# Patient Record
Sex: Male | Born: 2000 | Race: White | Hispanic: No | Marital: Single | Smoking: Never smoker
Health system: Southern US, Community
[De-identification: ages and names within clinical notes are randomized; demographics above are authoritative.]

---
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
## 2024-02-01 ENCOUNTER — Ambulatory Visit (INDEPENDENT_AMBULATORY_CARE_PROVIDER_SITE_OTHER): Payer: Self-pay | Admitting: Family Medicine

## 2024-02-01 ENCOUNTER — Encounter: Payer: Self-pay | Admitting: Family Medicine

## 2024-02-01 ENCOUNTER — Other Ambulatory Visit: Payer: Self-pay

## 2024-02-01 VITALS — BP 118/81 | HR 59 | Temp 98.6°F | Ht 73.5 in | Wt 149.9 lb

## 2024-02-01 DIAGNOSIS — M546 Pain in thoracic spine: Secondary | ICD-10-CM | POA: Diagnosis not present

## 2024-02-01 DIAGNOSIS — H538 Other visual disturbances: Secondary | ICD-10-CM | POA: Diagnosis not present

## 2024-02-01 DIAGNOSIS — Z113 Encounter for screening for infections with a predominantly sexual mode of transmission: Secondary | ICD-10-CM

## 2024-02-01 DIAGNOSIS — Z0000 Encounter for general adult medical examination without abnormal findings: Secondary | ICD-10-CM

## 2024-02-01 MED ORDER — OMEPRAZOLE 20 MG PO CPDR: 20.0000 mg | DELAYED_RELEASE_CAPSULE | Freq: Every day | ORAL | 3 refills | Status: DC

## 2024-02-01 NOTE — Progress Notes (Unsigned)
 BP 118/81 (BP Location: Right Arm, Patient Position: Sitting, Cuff Size: Normal)   Pulse (!) 59   Temp 98.6 F (37 C) (Oral)   Ht 6' 1.5" (1.867 m)   Wt 149 lb 14.4 oz (68 kg)   SpO2 97%   BMI 19.51 kg/m    Subjective:    Patient ID: Aaron Mccann, male    DOB: 06-29-2001, 23 y.o.   MRN: [id number]  HPI: Aaron Mccann is a 23 y.o. male presenting on 02/01/2024 to establish care and for comprehensive medical examination. Current medical complaints include:  BACK PAIN Duration: about a year Mechanism of injury: unknown Location: midline and upper back Onset: sudden Severity: severe Quality: sharp Frequency: when he drinks alcohol Radiation: none Aggravating factors: drinking alcohol Alleviating factors: nothing Status: fluctuating Treatments attempted: none  Relief with NSAIDs?: no Nighttime pain:  no Paresthesias / decreased sensation:  no Bowel / bladder incontinence:  no Fevers:  no Dysuria / urinary frequency:  no  Interim Problems from his last visit: no  Depression Screen done today and results listed below:     02/01/2024   11:37 AM  Depression screen PHQ 2/9  Decreased Interest 0  Down, Depressed, Hopeless 0  PHQ - 2 Score 0  Altered sleeping 1  Tired, decreased energy 0  Change in appetite 0  Feeling bad or failure about yourself  0  Trouble concentrating 0  Moving slowly or fidgety/restless 0  Suicidal thoughts 0  PHQ-9 Score 1  Difficult doing work/chores Not difficult at all    Past Medical History:  History reviewed. No pertinent past medical history.  Surgical History:  History reviewed. No pertinent surgical history.  Medications:  No current outpatient medications on file prior to visit.   No current facility-administered medications on file prior to visit.    Allergies:  No Known Allergies  Social History:  Social History   Socioeconomic History   Marital status: Single    Spouse name: Not on file   Number of  children: Not on file   Years of education: Not on file   Highest education level: Not on file  Occupational History   Not on file  Tobacco Use   Smoking status: Never   Smokeless tobacco: Never  Vaping Use   Vaping status: Every Day  Substance and Sexual Activity   Alcohol use: Not Currently   Drug use: Yes    Frequency: 3.0 times per week    Types: Marijuana   Sexual activity: Not Currently  Other Topics Concern   Not on file  Social History Narrative   ** Merged History Encounter **       Social Drivers of Health   Financial Resource Strain: Low Risk  (02/01/2024)   Overall Financial Resource Strain (CARDIA)    Difficulty of Paying Living Expenses: Not hard at all  Food Insecurity: No Food Insecurity (02/01/2024)   Hunger Vital Sign    Worried About Running Out of Food in the Last Year: Never true    Ran Out of Food in the Last Year: Never true  Transportation Needs: No Transportation Needs (02/01/2024)   PRAPARE - Administrator, Civil Service (Medical): No    Lack of Transportation (Non-Medical): No  Physical Activity: Inactive (02/01/2024)   Exercise Vital Sign    Days of Exercise per Week: 0 days    Minutes of Exercise per Session: 0 min  Stress: No Stress Concern Present (02/01/2024)   [place]  [employer] - Occupational Stress Questionnaire    Feeling of Stress : Not at all  Social Connections: Not on file  Intimate Partner Violence: Not At Risk (02/01/2024)   Humiliation, Afraid, Rape, and Kick questionnaire    Fear of Current or Ex-Partner: No    Emotionally Abused: No    Physically Abused: No    Sexually Abused: No   Social History   Tobacco Use  Smoking Status Never  Smokeless Tobacco Never   Social History   Substance and Sexual Activity  Alcohol Use Not Currently    Family History:  Family History  Problem Relation Age of Onset   Multiple sclerosis Mother    Ulcers Father    Breast cancer Maternal Grandmother     Cancer Maternal Grandfather     Past medical history, surgical history, medications, allergies, family history and social history reviewed with patient today and changes made to appropriate areas of the chart.   Review of Systems  Constitutional: Negative.   HENT: Negative.    Eyes:  Positive for blurred vision. Negative for double vision, photophobia, pain, discharge and redness.  Respiratory: Negative.    Cardiovascular: Negative.   Gastrointestinal: Negative.   Genitourinary: Negative.   Musculoskeletal:  Positive for back pain. Negative for falls, joint pain, myalgias and neck pain.  Skin: Negative.   Neurological: Negative.   Endo/Heme/Allergies: Negative.   Psychiatric/Behavioral: Negative.     All other ROS negative except what is listed above and in the HPI.      Objective:    BP 118/81 (BP Location: Right Arm, Patient Position: Sitting, Cuff Size: Normal)   Pulse (!) 59   Temp 98.6 F (37 C) (Oral)   Ht 6' 1.5" (1.867 m)   Wt 149 lb 14.4 oz (68 kg)   SpO2 97%   BMI 19.51 kg/m   Wt Readings from Last 3 Encounters:  02/01/24 149 lb 14.4 oz (68 kg)  04/08/19 130 lb 1.1 oz (59 kg) (35%, Z= -0.40)*  04/08/19 130 lb (59 kg) (34%, Z= -0.40)*   * Growth percentiles are based on CDC (Boys, 2-20 Years) data.    Physical Exam Vitals and nursing note reviewed.  Constitutional:      General: He is not in acute distress.    Appearance: Normal appearance. He is not ill-appearing, toxic-appearing or diaphoretic.  HENT:     Head: Normocephalic and atraumatic.     Right Ear: Tympanic membrane, ear canal and external ear normal. There is no impacted cerumen.     Left Ear: Tympanic membrane, ear canal and external ear normal. There is no impacted cerumen.     Nose: Nose normal. No congestion or rhinorrhea.     Mouth/Throat:     Mouth: Mucous membranes are moist.     Pharynx: Oropharynx is clear. No oropharyngeal exudate or posterior oropharyngeal erythema.  Eyes:      General: No scleral icterus.       Right eye: No discharge.        Left eye: No discharge.     Extraocular Movements: Extraocular movements intact.     Conjunctiva/sclera: Conjunctivae normal.     Pupils: Pupils are equal, round, and reactive to light.  Neck:     Vascular: No carotid bruit.  Cardiovascular:     Rate and Rhythm: Normal rate and regular rhythm.     Pulses: Normal pulses.     Heart sounds: No murmur heard.    No friction rub.  No gallop.  Pulmonary:     Effort: Pulmonary effort is normal. No respiratory distress.     Breath sounds: Normal breath sounds. No stridor. No wheezing, rhonchi or rales.  Chest:     Chest wall: No tenderness.  Abdominal:     General: Abdomen is flat. Bowel sounds are normal. There is no distension.     Palpations: Abdomen is soft. There is no mass.     Tenderness: There is no abdominal tenderness. There is no right CVA tenderness, left CVA tenderness, guarding or rebound.     Hernia: No hernia is present.  Genitourinary:    Comments: Genital exam deferred with shared decision making Musculoskeletal:        General: No swelling, tenderness, deformity or signs of injury. Normal range of motion.     Cervical back: Normal range of motion and neck supple. No rigidity. No muscular tenderness.     Right lower leg: No edema.     Left lower leg: No edema.  Lymphadenopathy:     Cervical: No cervical adenopathy.  Skin:    General: Skin is warm and dry.     Capillary Refill: Capillary refill takes less than 2 seconds.     Coloration: Skin is not jaundiced or pale.     Findings: No bruising, erythema, lesion or rash.  Neurological:     General: No focal deficit present.     Mental Status: He is alert and oriented to person, place, and time.     Cranial Nerves: No cranial nerve deficit.     Sensory: No sensory deficit.     Motor: No weakness.     Coordination: Coordination normal.     Gait: Gait normal.     Deep Tendon Reflexes: Reflexes normal.   Psychiatric:        Mood and Affect: Mood normal.        Behavior: Behavior normal.        Thought Content: Thought content normal.        Judgment: Judgment normal.    Results for orders placed or performed during the hospital encounter of 04/08/19  Novel Coronavirus,NAA,(SEND-OUT TO REF LAB - TAT 24-48 hrs); Hosp Order   Collection Time: 04/08/19  7:45 PM   Specimen: Nasopharyngeal Swab; Respiratory  Result Value Ref Range   SARS-CoV-2, NAA NOT DETECTED NOT DETECTED   Coronavirus Source NASOPHARYNGEAL       Assessment & Plan:   Problem List Items Addressed This Visit   None    LABORATORY TESTING:  Health maintenance labs ordered today as discussed above.   IMMUNIZATIONS:   - Tdap: Tetanus vaccination status reviewed: {tetanus status:315746}. - Influenza: {Blank single:19197::"Up to date","Administered today","Postponed to flu season","Refused","Given elsewhere"} - Pneumovax: {Blank single:19197::"Up to date","Administered today","Not applicable","Refused","Given elsewhere"} - Prevnar: {Blank single:19197::"Up to date","Administered today","Not applicable","Refused","Given elsewhere"} - COVID: {Blank single:19197::"Up to date","Administered today","Not applicable","Refused","Given elsewhere"} - HPV: {Blank single:19197::"Up to date","Administered today","Not applicable","Refused","Given elsewhere"}  PATIENT COUNSELING:    Sexuality: Discussed sexually transmitted diseases, partner selection, use of condoms, avoidance of unintended pregnancy  and contraceptive alternatives.   Advised to avoid cigarette smoking.  I discussed with the patient that most people either abstain from alcohol or drink within safe limits (<=14/week and <=4 drinks/occasion for males, <=7/weeks and <= 3 drinks/occasion for females) and that the risk for alcohol disorders and other health effects rises proportionally with the number of drinks per week and how often a drinker exceeds daily  limits.  Discussed cessation/primary prevention of  drug use and availability of treatment for abuse.   Diet: Encouraged to adjust caloric intake to maintain  or achieve ideal body weight, to reduce intake of dietary saturated fat and total fat, to limit sodium intake by avoiding high sodium foods and not adding table salt, and to maintain adequate dietary potassium and calcium preferably from fresh fruits, vegetables, and low-fat dairy products.    stressed the importance of regular exercise  Injury prevention: Discussed safety belts, safety helmets, smoke detector, smoking near bedding or upholstery.   Dental health: Discussed importance of regular tooth brushing, flossing, and dental visits.   Follow up plan: NEXT PREVENTATIVE PHYSICAL DUE IN 1 YEAR. No follow-ups on file.

## 2024-02-02 LAB — COMPREHENSIVE METABOLIC PANEL WITH GFR
ALT: 11 IU/L (ref 0–44)
AST: 16 IU/L (ref 0–40)
Albumin: 5.5 g/dL — ABNORMAL HIGH (ref 4.3–5.2)
Alkaline Phosphatase: 73 IU/L (ref 44–121)
BUN/Creatinine Ratio: 6 — ABNORMAL LOW (ref 9–20)
BUN: 6 mg/dL (ref 6–20)
Bilirubin Total: 0.6 mg/dL (ref 0.0–1.2)
CO2: 25 mmol/L (ref 20–29)
Calcium: 9.9 mg/dL (ref 8.7–10.2)
Chloride: 98 mmol/L (ref 96–106)
Creatinine, Ser: 0.93 mg/dL (ref 0.76–1.27)
Globulin, Total: 2.6 g/dL (ref 1.5–4.5)
Glucose: 71 mg/dL (ref 70–99)
Potassium: 3.9 mmol/L (ref 3.5–5.2)
Sodium: 140 mmol/L (ref 134–144)
Total Protein: 8.1 g/dL (ref 6.0–8.5)
eGFR: 120 mL/min/{1.73_m2} (ref >59)

## 2024-02-02 LAB — TSH: TSH: 0.846 u[IU]/mL (ref 0.450–4.500)

## 2024-02-02 LAB — CBC WITH DIFFERENTIAL/PLATELET
Basophils Absolute: 0.1 10*3/uL (ref 0.0–0.2)
Basos: 1 %
EOS (ABSOLUTE): 0.1 10*3/uL (ref 0.0–0.4)
Eos: 1 %
Hematocrit: 48.4 % (ref 37.5–51.0)
Hemoglobin: 16.2 g/dL (ref 13.0–17.7)
Immature Grans (Abs): 0 10*3/uL (ref 0.0–0.1)
Immature Granulocytes: 0 %
Lymphocytes Absolute: 1.7 10*3/uL (ref 0.7–3.1)
Lymphs: 33 %
MCH: 30.1 pg (ref 26.6–33.0)
MCHC: 33.5 g/dL (ref 31.5–35.7)
MCV: 90 fL (ref 79–97)
Monocytes Absolute: 0.5 10*3/uL (ref 0.1–0.9)
Monocytes: 9 %
Neutrophils Absolute: 2.8 10*3/uL (ref 1.4–7.0)
Neutrophils: 56 %
Platelets: 229 10*3/uL (ref 150–450)
RBC: 5.39 x10E6/uL (ref 4.14–5.80)
RDW: 13.3 % (ref 11.6–15.4)
WBC: 5 10*3/uL (ref 3.4–10.8)

## 2024-02-02 LAB — LIPID PANEL W/O CHOL/HDL RATIO
Cholesterol, Total: 235 mg/dL — ABNORMAL HIGH (ref 100–199)
HDL: 39 mg/dL — ABNORMAL LOW (ref >39)
LDL Chol Calc (NIH): 173 mg/dL — ABNORMAL HIGH (ref 0–99)
Triglycerides: 127 mg/dL (ref 0–149)
VLDL Cholesterol Cal: 23 mg/dL (ref 5–40)

## 2024-02-02 LAB — TREPONEMAL ANTIBODIES, TPPA: Treponemal Antibodies, TPPA: NONREACTIVE

## 2024-02-02 LAB — ACUTE VIRAL HEPATITIS (HAV, HBV, HCV)
HCV Ab: NONREACTIVE
Hep A IgM: NEGATIVE
Hep B C IgM: NEGATIVE
Hepatitis B Surface Ag: NEGATIVE

## 2024-02-02 LAB — HSV 1 AND 2 AB, IGG
HSV 1 Glycoprotein G Ab, IgG: NONREACTIVE
HSV 2 IgG, Type Spec: NONREACTIVE

## 2024-02-02 LAB — HCV INTERPRETATION

## 2024-02-02 LAB — HIV ANTIBODY (ROUTINE TESTING W REFLEX): HIV Screen 4th Generation wRfx: NONREACTIVE

## 2024-02-02 LAB — RPR W/REFLEX TO TREPSURE: RPR: NONREACTIVE

## 2024-02-03 LAB — GC/CHLAMYDIA PROBE AMP
Chlamydia trachomatis, NAA: NEGATIVE
Neisseria Gonorrhoeae by PCR: NEGATIVE

## 2024-02-07 ENCOUNTER — Encounter: Payer: Self-pay | Admitting: Family Medicine

## 2024-03-01 DIAGNOSIS — M259 Joint disorder, unspecified: Secondary | ICD-10-CM | POA: Insufficient documentation

## 2024-04-05 ENCOUNTER — Telehealth (INDEPENDENT_AMBULATORY_CARE_PROVIDER_SITE_OTHER): Admitting: Family Medicine

## 2024-04-05 ENCOUNTER — Encounter: Payer: Self-pay | Admitting: Family Medicine

## 2024-04-05 ENCOUNTER — Ambulatory Visit: Admitting: Family Medicine

## 2024-04-05 DIAGNOSIS — M546 Pain in thoracic spine: Secondary | ICD-10-CM | POA: Diagnosis not present

## 2024-04-05 NOTE — Progress Notes (Signed)
 There were no vitals taken for this visit.   Subjective:    Patient ID: Aaron Mccann, male    DOB: 02-Mar-2001, 23 y.o.   MRN: [id number]  HPI: [name] is a 23 y.o. male  Chief Complaint  Patient presents with   Back Pain    2 month f/up- patient states he only experiences the pain when he drinks. States he rarely drinks.    BACK PAIN Duration: only when he was drinking- has not been drinking. No pain Mechanism of injury: unknown Location: midline upper back Onset: sudden Severity: severe Quality: stabbing Frequency: very rarely Radiation: none Aggravating factors: drinking alcohol Alleviating factors: nothing Status: resolved Treatments attempted: omeprazole  x1  Relief with NSAIDs?: no Nighttime pain:  no Paresthesias / decreased sensation:  no Bowel / bladder incontinence:  no Fevers:  no Dysuria / urinary frequency:  no  Relevant past medical, surgical, family and social history reviewed and updated as indicated. Interim medical history since our last visit reviewed. Allergies and medications reviewed and updated.  Review of Systems  Constitutional: Negative.   Respiratory: Negative.    Cardiovascular: Negative.   Gastrointestinal: Negative.   Musculoskeletal: Negative.   Psychiatric/Behavioral: Negative.      Per HPI unless specifically indicated above     Objective:    There were no vitals taken for this visit.  Wt Readings from Last 3 Encounters:  02/01/24 149 lb 14.4 oz (68 kg)  04/08/19 130 lb 1.1 oz (59 kg) (35%, Z= -0.40)*  04/08/19 130 lb (59 kg) (34%, Z= -0.40)*   * Growth percentiles are based on CDC (Boys, 2-20 Years) data.    Physical Exam Constitutional:      General: He is not in acute distress.    Appearance: Normal appearance. He is well-developed. He is obese. He is not ill-appearing, toxic-appearing or diaphoretic.  HENT:     Head: Normocephalic and atraumatic.     Right Ear: Hearing and external ear normal.      Left Ear: Hearing and external ear normal.     Nose: Nose normal.   Eyes:     General: Lids are normal. No scleral icterus.       Right eye: No discharge.        Left eye: No discharge.     Extraocular Movements: Extraocular movements intact.     Conjunctiva/sclera: Conjunctivae normal.     Pupils: Pupils are equal, round, and reactive to light.   Pulmonary:     Effort: Pulmonary effort is normal. No respiratory distress.   Musculoskeletal:        General: Normal range of motion.     Cervical back: Normal range of motion.   Skin:    Coloration: Skin is not jaundiced or pale.     Findings: No bruising, erythema, lesion or rash.   Neurological:     General: No focal deficit present.     Mental Status: He is alert and oriented to person, place, and time.   Psychiatric:        Mood and Affect: Mood normal.        Speech: Speech normal.        Behavior: Behavior normal.        Thought Content: Thought content normal.        Judgment: Judgment normal.     Results for orders placed or performed in visit on 02/01/24  GC/Chlamydia Probe Amp   Collection Time: 02/01/24 12:00 PM  Specimen: Urine   UR  Result Value Ref Range   Chlamydia trachomatis, NAA Negative Negative   Neisseria Gonorrhoeae by PCR Negative Negative  Treponemal Antibodies, TPPA   Collection Time: 2024-02-11 12:08 PM  Result Value Ref Range   Treponemal Antibodies, TPPA Non Reactive Non Reactive   Interpretation: Comment   Comprehensive metabolic panel with GFR   Collection Time: 02-11-2024 12:08 PM  Result Value Ref Range   Glucose 71 70 - 99 mg/dL   BUN 6 6 - 20 mg/dL   Creatinine, Ser 1.61 0.76 - 1.27 mg/dL   eGFR 096 >04 VW/UJW/1.19   BUN/Creatinine Ratio 6 (L) 9 - 20   Sodium 140 134 - 144 mmol/L   Potassium 3.9 3.5 - 5.2 mmol/L   Chloride 98 96 - 106 mmol/L   CO2 25 20 - 29 mmol/L   Calcium 9.9 8.7 - 10.2 mg/dL   Total Protein 8.1 6.0 - 8.5 g/dL   Albumin 5.5 (H) 4.3 - 5.2 g/dL   Globulin,  Total 2.6 1.5 - 4.5 g/dL   Bilirubin Total 0.6 0.0 - 1.2 mg/dL   Alkaline Phosphatase 73 44 - 121 IU/L   AST 16 0 - 40 IU/L   ALT 11 0 - 44 IU/L  CBC with Differential/Platelet   Collection Time: 02-11-2024 12:08 PM  Result Value Ref Range   WBC 5.0 3.4 - 10.8 x10E3/uL   RBC 5.39 4.14 - 5.80 x10E6/uL   Hemoglobin 16.2 13.0 - 17.7 g/dL   Hematocrit 14.7 82.9 - 51.0 %   MCV 90 79 - 97 fL   MCH 30.1 26.6 - 33.0 pg   MCHC 33.5 31.5 - 35.7 g/dL   RDW 56.2 13.0 - 86.5 %   Platelets 229 150 - 450 x10E3/uL   Neutrophils 56 Not Estab. %   Lymphs 33 Not Estab. %   Monocytes 9 Not Estab. %   Eos 1 Not Estab. %   Basos 1 Not Estab. %   Neutrophils Absolute 2.8 1.4 - 7.0 x10E3/uL   Lymphocytes Absolute 1.7 0.7 - 3.1 x10E3/uL   Monocytes Absolute 0.5 0.1 - 0.9 x10E3/uL   EOS (ABSOLUTE) 0.1 0.0 - 0.4 x10E3/uL   Basophils Absolute 0.1 0.0 - 0.2 x10E3/uL   Immature Granulocytes 0 Not Estab. %   Immature Grans (Abs) 0.0 0.0 - 0.1 x10E3/uL  Lipid Panel w/o Chol/HDL Ratio   Collection Time: Feb 11, 2024 12:08 PM  Result Value Ref Range   Cholesterol, Total 235 (H) 100 - 199 mg/dL   Triglycerides 784 0 - 149 mg/dL   HDL 39 (L) >69 mg/dL   VLDL Cholesterol Cal 23 5 - 40 mg/dL   LDL Chol Calc (NIH) 629 (H) 0 - 99 mg/dL  TSH   Collection Time: 02-11-24 12:08 PM  Result Value Ref Range   TSH 0.846 0.450 - 4.500 uIU/mL  HIV Antibody (routine testing w rflx)   Collection Time: 2024-02-11 12:08 PM  Result Value Ref Range   HIV Screen 4th Generation wRfx Non Reactive Non Reactive  RPR w/reflex to TrepSure   Collection Time: 02-11-2024 12:08 PM  Result Value Ref Range   RPR Non Reactive Non Reactive  Acute Viral Hepatitis (HAV, HBV, HCV)   Collection Time: 11-Feb-2024 12:08 PM  Result Value Ref Range   Hep A IgM Negative Negative   Hepatitis B Surface Ag Negative Negative   Hep B C IgM Negative Negative   HCV Ab Non Reactive Non Reactive  HSV 1 and 2 Ab, IgG  Collection Time: 02/01/24 12:08 PM   Result Value Ref Range   HSV 1 Glycoprotein G Ab, IgG Non Reactive Non Reactive   HSV 2 IgG, Type Spec Non Reactive Non Reactive  Interpretation:   Collection Time: 02/01/24 12:08 PM  Result Value Ref Range   HCV Interp 1: Comment       Assessment & Plan:   Problem List Items Addressed This Visit   None Visit Diagnoses       Midline thoracic back pain, unspecified chronicity    -  Primary   Resolved. Not having any issues. Call with any concerns. Continue to monitor.        Follow up plan: Return April, Physical.    This visit was completed via video visit through MyChart due to the restrictions of the COVID-19 pandemic. All issues as above were discussed and addressed. Physical exam was done as above through visual confirmation on video through MyChart. If it was felt that the patient should be evaluated in the office, they were directed there. The patient verbally consented to this visit. Location of the patient: home Location of the provider: work Those involved with this call:  Provider: Terre Ferri, DO CMA: Linda Repress, CMA, Front Desk/Registration: Jaynee Meyer  Time spent on call: 15 minutes with patient face to face via video conference. More than 50% of this time was spent in counseling and coordination of care. 23 minutes total spent in review of patient's record and preparation of their chart.

## 2024-04-06 NOTE — Progress Notes (Signed)
 Called patient left message for patient to call back and schedule April, Physical

## 2024-04-13 NOTE — Progress Notes (Signed)
 Appointment has been made and mailed to the patient

## 2024-04-29 ENCOUNTER — Other Ambulatory Visit: Payer: Self-pay | Admitting: Family Medicine

## 2024-05-01 NOTE — Telephone Encounter (Signed)
 Requested Prescriptions  Pending Prescriptions Disp Refills   omeprazole  (PRILOSEC) 20 MG capsule [Pharmacy Med Name: OMEPRAZOLE  DR 20 MG CAPSULE] 90 capsule 0    Sig: TAKE 1 CAPSULE BY MOUTH EVERY DAY     Gastroenterology: Proton Pump Inhibitors Passed - 05/01/2024 10:54 AM      Passed - Valid encounter within last 12 months    Recent Outpatient Visits           3 weeks ago Midline thoracic back pain, unspecified chronicity   [facility] [place], Megan P, DO   3 months ago Routine general medical examination at a health care facility   [facility], Megan P, DO

## 2024-06-10 ENCOUNTER — Telehealth: Admitting: Family

## 2024-06-10 DIAGNOSIS — G43109 Migraine with aura, not intractable, without status migrainosus: Secondary | ICD-10-CM | POA: Diagnosis not present

## 2024-06-10 NOTE — Patient Instructions (Signed)

## 2024-06-10 NOTE — Progress Notes (Signed)
 Virtual Visit Consent   Aaron Mccann, you are scheduled for a virtual visit with a [facility] provider today. Just as with appointments in the office, your consent must be obtained to participate. Your consent will be active for this visit and any virtual visit you may have with one of our providers in the next 365 days. If you have a MyChart account, a copy of this consent can be sent to you electronically.  As this is a virtual visit, video technology does not allow for your provider to perform a traditional examination. This may limit your provider's ability to fully assess your condition. If your provider identifies any concerns that need to be evaluated in person or the need to arrange testing (such as labs, EKG, etc.), we will make arrangements to do so. Although advances in technology are sophisticated, we cannot ensure that it will always work on either your end or our end. If the connection with a video visit is poor, the visit may have to be switched to a telephone visit. With either a video or telephone visit, we are not always able to ensure that we have a secure connection.  By engaging in this virtual visit, you consent to the provision of healthcare and authorize for your insurance to be billed (if applicable) for the services provided during this visit. Depending on your insurance coverage, you may receive a charge related to this service.  I need to obtain your verbal consent now. Are you willing to proceed with your visit today? Aaron Mccann has provided verbal consent on 06/10/2024 for a virtual visit (video or telephone). Bari Learn, FNP  Date: 06/10/2024 7:42 PM   Virtual Visit via Video Note   I, Bari Learn, connected with  Aaron Mccann  ([id number], 2000-11-03) on 06/10/24 at  7:45 PM EDT by a video-enabled telemedicine application and verified that I am speaking with the correct person using two identifiers.  Location: Patient: Virtual Visit Location  Patient: Home Provider: Virtual Visit Location Provider: Home Office   I discussed the limitations of evaluation and management by telemedicine and the availability of in person appointments. The patient expressed understanding and agreed to proceed.    History of Present Illness: Aaron Mccann is a 23 y.o. who identifies as a male who was assigned male at birth, and is being seen today for migraine that started today. Reports he missed work and needs a note to return. Reports his headache has resolved now.   HPI: Migraine  This is a new problem. The current episode started today. The problem occurs intermittently. The problem has been unchanged. The pain is located in the Frontal region. The pain is at a severity of 7/10. The pain is mild. Associated symptoms include nausea, phonophobia and photophobia. Pertinent negatives include no blurred vision, coughing, dizziness, sore throat, swollen glands or vomiting.    Problems:  Patient Active Problem List   Diagnosis Date Noted   Disorder of ankle 03/01/2024    Allergies: No Known Allergies Medications:  Current Outpatient Medications:    omeprazole  (PRILOSEC) 20 MG capsule, TAKE 1 CAPSULE BY MOUTH EVERY DAY, Disp: 90 capsule, Rfl: 0  Observations/Objective: Patient is well-developed, well-nourished in no acute distress.  Resting comfortably  at home.  Head is normocephalic, atraumatic.  No labored breathing. Speech is clear and coherent with logical content.  Patient is alert and oriented at baseline.    Assessment and Plan: 1. Migraine with aura and without status migrainosus, not  intractable (Primary)  Force fluids Encouraged at least 8 hours of sleep  Stress management  Encourage headache journal Work note given  Tylenol as needed Follow up if symptoms worsen or do not improve   Follow Up Instructions: I discussed the assessment and treatment plan with the patient. The patient was provided an opportunity to ask  questions and all were answered. The patient agreed with the plan and demonstrated an understanding of the instructions.  A copy of instructions were sent to the patient via MyChart unless otherwise noted below.     The patient was advised to call back or seek an in-person evaluation if the symptoms worsen or if the condition fails to improve as anticipated.    Bari Learn, FNP

## 2025-01-30 ENCOUNTER — Encounter: Admitting: Family Medicine
